# Patient Record
Sex: Female | Born: 2000 | Race: Black or African American | Hispanic: No | Marital: Single | Smoking: Never smoker
Health system: Southern US, Community
[De-identification: ages and names within clinical notes are randomized; demographics above are authoritative.]

## PROBLEM LIST (undated history)

## (undated) DIAGNOSIS — G43909 Migraine, unspecified, not intractable, without status migrainosus: Secondary | ICD-10-CM

## (undated) HISTORY — DX: Migraine, unspecified, not intractable, without status migrainosus: G43.909

## (undated) HISTORY — PX: NO PAST SURGERIES: SHX2092

---

## 2019-10-30 ENCOUNTER — Ambulatory Visit: Payer: Self-pay

## 2019-11-23 ENCOUNTER — Encounter: Payer: Self-pay | Admitting: Neurology

## 2019-11-23 ENCOUNTER — Other Ambulatory Visit: Payer: Self-pay

## 2019-11-23 ENCOUNTER — Ambulatory Visit: Payer: BC Managed Care – PPO | Admitting: Neurology

## 2019-11-23 VITALS — BP 122/84 | HR 88 | Temp 98.0°F | Ht 65.0 in | Wt 179.0 lb

## 2019-11-23 DIAGNOSIS — G43019 Migraine without aura, intractable, without status migrainosus: Secondary | ICD-10-CM

## 2019-11-23 MED ORDER — AIMOVIG 140 MG/ML ~~LOC~~ SOAJ
140.0000 mg | SUBCUTANEOUS | 5 refills | Status: AC
Start: 2019-11-23 — End: ?

## 2019-11-23 MED ORDER — UBRELVY 50 MG PO TABS: 50.0000 mg | ORAL_TABLET | ORAL | 3 refills | Status: AC | PRN

## 2019-11-23 MED ORDER — PROPRANOLOL HCL 10 MG PO TABS: 10.0000 mg | ORAL_TABLET | ORAL | 3 refills | Status: AC | PRN

## 2019-11-23 NOTE — Patient Instructions (Addendum)
Your neurological exam is normal thankfully.  Nevertheless, you have had an increase in your migraines.  For sexual activity related migraines you can try propranolol low-dose 10 mg strength half an hour to 1 hour before sexual activity.  For as needed use for acute migraines you can try Ubrelvy. Start Ubrelvy, 50 mg strength: Take 1 pill at onset of migraine headache, may repeat in 2 hours, no more than 2 pills in 24 hours. May cause sedation and nausea.  You should not combine Ubrelvy with Imitrex, only use 1 or the other. As discussed, we will start for migraine prevention: Aimovig 140 mg/ml, 1 inj subcutaneously every 30 days.  We can help you with your first injection and education.  Potential side effects include:  Gastrointestinal: Constipation (3%) Immunologic: Antibody development (3% to 6%) Local: Injection site reaction (5% to 6%) Neuromuscular & skeletal: Muscle cramps (?2%), muscle spasm (?2%) Dermatologic: Injection site reactions include itching, redness, pain at the injection site and very rarely: Anaphylaxis, angioedema (severe swelling including around mouth and tongue), hypersensitivity reaction (rare).    Please note that none of these above medications are considered safe in pregnancy.  As discussed, we will proceed with imaging testing including a brain MRI and MR angiogram to look at your blood vessels of the brain.  We will call you with the results.  We like to get insurance authorization first.  Please follow-up in 3 months to see one of our nurse practitioners.

## 2019-11-23 NOTE — Progress Notes (Signed)
Subjective:    Patient ID: Katherine Walton is a 19 y.o. female.  HPI     Star Age, MD, PhD [facility] [address], [address] [place], [place] [address]    Dear Dr. Ardeth Perfect,   I saw your patient, Katherine Walton, Upon your kind request in my neurologic clinic today for initial consultation of her migraine headaches.  The patient is unaccompanied today.  As you know, Katherine Walton is a 19 year old right-handed woman with an underlying medical history of borderline obesity, who reports a history of migraines for the past several years, essentially since she was about 34 or 19 years old.  She has been on ibuprofen as needed.  She has been on sumatriptan since she was in middle school as she recalls.  She has had infrequent migraines previously, 1 every 2 to 3 months typically.  In the recent month she has noticed an increase in her migraines to up to about 5/month this last month.  She has noted that her migraine has been connected to post sexual activity.  She typically tries to hydrate well.  She has taken ibuprofen and at times she has taken Imitrex.  She has never been on a preventative medication and would be interested in being able to take something to prevent migraines connected to sexual activity.  She is a non-smoker of cigarettes but does smoke marijuana socially.  She drinks alcohol infrequently, once or twice per month, she does not always hydrate well with water, averages about 2 bottles of water per day she estimates.  She has a family history of migraines affecting her father.  She has never had any one-sided weakness or numbness or tingling or droopy face or slurring of speech.  She typically does not have a visual aura.  She had an eye examination within a year.  She has not tried any other medications, has been on as needed nausea medication.  She has associated nausea, light sensitivity, and a throbbing headache, which can start in the back of her head.   She has noticed that it does help to sleep.   Her Past Medical History Is Significant For: Past Medical History:  Diagnosis Date  . Migraine     Her Past Surgical History Is Significant For: Past Surgical History:  Procedure Laterality Date  . NO PAST SURGERIES      Her Family History Is Significant For: Family History  Problem Relation Age of Onset  . Hypertension Mother   . Thyroid disease Mother   . Thyroid disease Father   . Diabetes Maternal Grandmother   . Diabetes Paternal Grandfather     Her Social History Is Significant For: Social History   Socioeconomic History  . Marital status: Single    Spouse name: Not on file  . Number of children: Not on file  . Years of education: Not on file  . Highest education level: Not on file  Occupational History  . Not on file  Tobacco Use  . Smoking status: Never Smoker  . Smokeless tobacco: Never Used  Substance and Sexual Activity  . Alcohol use: Yes    Comment: social  . Drug use: Yes    Types: Marijuana    Comment: Socially  . Sexual activity: Not on file  Other Topics Concern  . Not on file  Social History Narrative  . Not on file   Social Determinants of Health   Financial Resource Strain:   . Difficulty of Paying  Living Expenses:   Food Insecurity:   . Worried About Programme researcher, broadcasting/film/video in the Last Year:   . Barista in the Last Year:   Transportation Needs:   . Freight forwarder (Medical):   Marland Kitchen Lack of Transportation (Non-Medical):   Physical Activity:   . Days of Exercise per Week:   . Minutes of Exercise per Session:   Stress:   . Feeling of Stress :   Social Connections:   . Frequency of Communication with Friends and Family:   . Frequency of Social Gatherings with Friends and Family:   . Attends Religious Services:   . Active Member of Clubs or Organizations:   . Attends Banker Meetings:   Marland Kitchen Marital Status:     Her Allergies Are:  No Known Allergies:   Her  Current Medications Are:  Outpatient Encounter Medications as of 11/23/2019  Medication Sig  . Etonogestrel (NEXPLANON [place]) Inject into the skin.  Marland Kitchen ibuprofen (ADVIL) 800 MG tablet Take 800 mg by mouth 3 (three) times daily as needed.  . norethindrone (AYGESTIN) 5 MG tablet Take 10 mg by mouth daily.  . SUMAtriptan (IMITREX) 25 MG tablet TAKE 1 PILL AT THE ONSET OF A MIGRAINE CAN REPEAT DOSE IN 2 HR IF NEEDED   No facility-administered encounter medications on file as of 11/23/2019.  : Review of Systems:  Out of a complete 14 point review of systems, all are reviewed and negative with the exception of these symptoms as listed below:  Review of Systems  Neurological:       Here for consult on migraines. She reports 5-6 migraines this month. She reports she is taking ibuprofen, sumatriptan to help but has not had much relief. Pt notices when she is intimate this is a trigger for her migraines.     Objective:  Neurological Exam  Physical Exam Physical Examination:   Vitals:   11/23/19 1448  BP: 122/84  Pulse: 88  Temp: 98 F (36.7 C)  SpO2: 98%    General Examination: The patient is a very pleasant 19 y.o. female in no acute distress. She appears well-developed and well-nourished and well groomed.   HEENT: Normocephalic, atraumatic, pupils are equal, round and reactive to light and accommodation. Funduscopic exam is normal with sharp disc margins noted.  She has corrective eyeglasses.  Extraocular tracking is good without limitation to gaze excursion or nystagmus noted. Normal smooth pursuit is noted. Hearing is grossly intact. Face is symmetric with normal facial animation and normal facial sensation. Speech is clear with no dysarthria noted. There is no hypophonia. There is no lip, neck/head, jaw or voice tremor. Neck is supple with full range of passive and active motion. There are no carotid bruits on auscultation. Oropharynx exam reveals: mild mouth dryness, adequate dental hygiene.  Tongue protrudes centrally and palate elevates symmetrically.   Chest: Clear to auscultation without wheezing, rhonchi or crackles noted.  Heart: S1+S2+0, regular and normal without murmurs, rubs or gallops noted.   Abdomen: Soft, non-tender and non-distended with normal bowel sounds appreciated on auscultation.  Extremities: There is no pitting edema in the distal lower extremities bilaterally. Pedal pulses are intact.  Skin: Warm and dry without trophic changes noted.  Musculoskeletal: exam reveals no obvious joint deformities, tenderness or joint swelling or erythema.   Neurologically:  Mental status: The patient is awake, alert and oriented in all 4 spheres. Her immediate and remote memory, attention, language skills and fund of knowledge are  appropriate. There is no evidence of aphasia, agnosia, apraxia or anomia. Speech is clear with normal prosody and enunciation. Thought process is linear. Mood is normal and affect is normal.  Cranial nerves II - XII are as described above under HEENT exam. In addition: shoulder shrug is normal with equal shoulder height noted. Motor exam: Normal bulk, strength and tone is noted. There is no drift, tremor or rebound. Romberg is negative. Reflexes are 2+ throughout. Babinski: Toes are flexor bilaterally. Fine motor skills and coordination: intact with normal finger taps, normal hand movements, normal rapid alternating patting, normal foot taps and normal foot agility.  Cerebellar testing: No dysmetria or intention tremor on finger to nose testing. Heel to shin is unremarkable bilaterally. There is no truncal or gait ataxia.  Sensory exam: intact to light touch, vibration, temperature sense in the upper and lower extremities.  Gait, station and balance: She stands easily. No veering to one side is noted. No leaning to one side is noted. Posture is age-appropriate and stance is narrow based. Gait shows normal stride length and normal pace. No problems  turning are noted. Tandem walk is unremarkable.   Assessment and Plan:   In summary, Katherine Walton is a very pleasant 19 y.o.-year old female with an underlying medical history of borderline obesity, who presents for evaluation of her migraine headaches.  In the past month she has noticed an increase in her migraine frequency and intensity, typically connected to having had sexual activity.  She has a longstanding history of migraines without aura and typically would have 1 migraine every 2 to 3 months and therefore was only on as needed medication.  We talked about her headache condition quite a bit today.  We talked about typical migraine triggers as well.  She has a nonfocal neurologic exam which is reassuring.  Nevertheless, given the change in her migraine pattern I would like to proceed with a brain MRI and MRA head.  We talked about management of migraines including abortive treatment and preventative treatment.  We talked about trying to prevent migraines and connection with sexual activity and I suggested a trial of low-dose propranolol about 30 to 60 minutes before planned sexual activity.  She can continue to use ibuprofen as needed and Imitrex as needed.  We also talked about the newer medication options.  I suggested a trial of as needed Ubrelvy 50 mg strength.  I talked about expectations and potential side effects.  She is advised not to combine this medication with Imitrex and only use one or the other.  We also talked about the newer injectable preventative medications.  She is advised to start Aimovig monthly injection.  We will call her with her brain scan results and plan to follow her routinely in the office in about 3 months to see one of our nurse practitioners.  She was provided with 3 prescriptions today and written instructions.  I answered all her questions today and she was in agreement with the plan.   Thank you very much for allowing me to participate in the care of this nice  patient. If I can be of any further assistance to you please do not hesitate to call me at [phone].  Sincerely,   Huston Foley, MD, PhD

## 2019-11-24 LAB — COMPREHENSIVE METABOLIC PANEL
ALT: 12 IU/L (ref 0–32)
AST: 14 IU/L (ref 0–40)
Albumin/Globulin Ratio: 2 (ref 1.2–2.2)
Albumin: 4.9 g/dL (ref 3.9–5.0)
Alkaline Phosphatase: 59 IU/L (ref 39–117)
BUN/Creatinine Ratio: 8 — ABNORMAL LOW (ref 9–23)
BUN: 6 mg/dL (ref 6–20)
Bilirubin Total: 0.3 mg/dL (ref 0.0–1.2)
CO2: 25 mmol/L (ref 20–29)
Calcium: 10 mg/dL (ref 8.7–10.2)
Chloride: 103 mmol/L (ref 96–106)
Creatinine, Ser: 0.73 mg/dL (ref 0.57–1.00)
GFR calc Af Amer: 136 mL/min/{1.73_m2} (ref >59)
GFR calc non Af Amer: 118 mL/min/{1.73_m2} (ref >59)
Globulin, Total: 2.4 g/dL (ref 1.5–4.5)
Glucose: 83 mg/dL (ref 65–99)
Potassium: 4 mmol/L (ref 3.5–5.2)
Sodium: 139 mmol/L (ref 134–144)
Total Protein: 7.3 g/dL (ref 6.0–8.5)

## 2019-11-25 ENCOUNTER — Telehealth: Payer: Self-pay

## 2019-11-25 NOTE — Telephone Encounter (Signed)
PA for Bernita Raisin has been submitted and received instant approval. (Key: [id number])  This request has been approved.  Please note any additional information provided by [employer] at the bottom of your screen. Pa is effective until 11/25/2019.

## 2019-12-01 ENCOUNTER — Telehealth: Payer: Self-pay | Admitting: Neurology

## 2019-12-01 NOTE — Telephone Encounter (Signed)
Patient is scheduled at [facility] for 01/12/20.

## 2019-12-07 ENCOUNTER — Telehealth: Payer: Self-pay

## 2019-12-07 NOTE — Telephone Encounter (Signed)
PA for Aimovig has been sent via cover my meds.   (Key: [id number])  [employer] is reviewing your PA request and will respond within 24 hours for Medicaid or up to 72 hours for non-Medicaid plans, based on the required timeframe determined by state or federal regulations. To check for an update later, open this request from your dashboard.

## 2019-12-08 NOTE — Telephone Encounter (Signed)
PA for aimovig has been denied due to the pt not trying and falling to 2 prophylactic therapies. Ex. Beta blockers, tricyclic antidepressant, calcium channel blocker ect. Pt has a discount card for aimvoig and should be able to use since she has Nurse, learning disability.

## 2020-01-03 NOTE — Telephone Encounter (Signed)
[employer] Auth: [id number] (exp. 01/03/20 to 02/01/20)

## 2020-01-12 ENCOUNTER — Other Ambulatory Visit: Payer: BC Managed Care – PPO

## 2020-02-28 ENCOUNTER — Ambulatory Visit: Payer: BC Managed Care – PPO | Admitting: Family Medicine

## 2020-02-28 ENCOUNTER — Encounter: Payer: Self-pay | Admitting: Family Medicine

## 2020-02-28 VITALS — BP 115/74 | HR 94 | Ht 65.0 in | Wt 179.0 lb

## 2020-02-28 DIAGNOSIS — G43019 Migraine without aura, intractable, without status migrainosus: Secondary | ICD-10-CM | POA: Diagnosis not present

## 2020-02-28 NOTE — Progress Notes (Addendum)
PATIENT: Katherine Walton DOB: [birth year]  REASON FOR VISIT: follow up HISTORY FROM: patient  Chief Complaint  Patient presents with  . Follow-up    rm 1, alone, pt states migraines are worse, has not started aimovig      HISTORY OF PRESENT ILLNESS: Today 02/28/20 Katherine Walton is a 19 y.o. female here today for follow up for migraines. MRI was ordered but not performed. She was prescribed Amovig monthly and Ubrelvy for abortive therapy. She has not started either of these medications. She reports that headaches have not changed. She takes ibuprofen multiple times a week due to persistent headaches. She has not had a "really bad" headache in quite sometime. She usually takes Imitrex for migraines but has not needed this recently. She was advised to use propranolol 10mg  30-60 minutes prior to sexual activity. She reports taking this once and did not suffer from a migraine with that encounter.   HISTORY: (copied from Dr note on 11/23/2019)  Dear Dr. 11/25/2019,   I saw your patient, Katherine Walton, Upon your kind request in my neurologic clinic today for initial consultation of her migraine headaches.  The patient is unaccompanied today.  As you know, Katherine Walton is a 19 year old right-handed woman with an underlying medical history of borderline obesity, who reports a history of migraines for the past several years, essentially since she was about 54 or 19 years old.  She has been on ibuprofen as needed.  She has been on sumatriptan since she was in middle school as she recalls.  She has had infrequent migraines previously, 1 every 2 to 3 months typically.  In the recent month she has noticed an increase in her migraines to up to about 5/month this last month.  She has noted that her migraine has been connected to post sexual activity.  She typically tries to hydrate well.  She has taken ibuprofen and at times she has taken Imitrex.  She has never been on a preventative medication and would be  interested in being able to take something to prevent migraines connected to sexual activity.  She is a non-smoker of cigarettes but does smoke marijuana socially.  She drinks alcohol infrequently, once or twice per month, she does not always hydrate well with water, averages about 2 bottles of water per day she estimates.  She has a family history of migraines affecting her father.  She has never had any one-sided weakness or numbness or tingling or droopy face or slurring of speech.  She typically does not have a visual aura.  She had an eye examination within a year.  She has not tried any other medications, has been on as needed nausea medication.  She has associated nausea, light sensitivity, and a throbbing headache, which can start in the back of her head.  She has noticed that it does help to sleep.    REVIEW OF SYSTEMS: Out of a complete 14 system review of symptoms, the patient complains only of the following symptoms, headaches and all other reviewed systems are negative.  ALLERGIES: No Known Allergies  HOME MEDICATIONS: Outpatient Medications Prior to Visit  Medication Sig Dispense Refill  . Etonogestrel (NEXPLANON [place]) Inject into the skin.    8 ibuprofen (ADVIL) 800 MG tablet Take 800 mg by mouth 3 (three) times daily as needed.    . norethindrone (AYGESTIN) 5 MG tablet Take 10 mg by mouth daily.    . propranolol (INDERAL) 10 MG tablet Take 1 tablet (10  mg total) by mouth as needed. 30 tablet 3  . SUMAtriptan (IMITREX) 25 MG tablet TAKE 1 PILL AT THE ONSET OF A MIGRAINE CAN REPEAT DOSE IN 2 HR IF NEEDED    . Ubrogepant (UBRELVY) 50 MG TABS Take 50 mg by mouth as needed (may repeat once in 2 hours. no more than 2 pills in 24 h.). 10 tablet 3  . Erenumab-aooe (AIMOVIG) 140 MG/ML SOAJ Inject 140 mg into the skin every 30 (thirty) days. (Patient not taking: Reported on 02/28/2020) 1 pen 5   No facility-administered medications prior to visit.    PAST MEDICAL HISTORY: Past Medical  History:  Diagnosis Date  . Migraine     PAST SURGICAL HISTORY: Past Surgical History:  Procedure Laterality Date  . NO PAST SURGERIES      FAMILY HISTORY: Family History  Problem Relation Age of Onset  . Hypertension Mother   . Thyroid disease Mother   . Thyroid disease Father   . Diabetes Maternal Grandmother   . Diabetes Paternal Grandfather     SOCIAL HISTORY: Social History   Socioeconomic History  . Marital status: Single    Spouse name: Not on file  . Number of children: Not on file  . Years of education: Not on file  . Highest education level: Not on file  Occupational History  . Not on file  Tobacco Use  . Smoking status: Never Smoker  . Smokeless tobacco: Never Used  Substance and Sexual Activity  . Alcohol use: Yes    Comment: social  . Drug use: Yes    Types: Marijuana    Comment: Socially  . Sexual activity: Not on file  Other Topics Concern  . Not on file  Social History Narrative  . Not on file   Social Determinants of Health   Financial Resource Strain:   . Difficulty of Paying Living Expenses:   Food Insecurity:   . Worried About Programme researcher, broadcasting/film/video in the Last Year:   . Barista in the Last Year:   Transportation Needs:   . Freight forwarder (Medical):   Marland Kitchen Lack of Transportation (Non-Medical):   Physical Activity:   . Days of Exercise per Week:   . Minutes of Exercise per Session:   Stress:   . Feeling of Stress :   Social Connections:   . Frequency of Communication with Friends and Family:   . Frequency of Social Gatherings with Friends and Family:   . Attends Religious Services:   . Active Member of Clubs or Organizations:   . Attends Banker Meetings:   Marland Kitchen Marital Status:   Intimate Partner Violence:   . Fear of Current or Ex-Partner:   . Emotionally Abused:   Marland Kitchen Physically Abused:   . Sexually Abused:       PHYSICAL EXAM  Vitals:   02/28/20 1533  BP: 115/74  Pulse: 94  Weight: 179 lb (81.2  kg)  Height: 5\' 5"  (1.651 m)   Body mass index is 29.79 kg/m.  Generalized: Well developed, in no acute distress  Cardiology: normal rate and rhythm, no murmur noted Respiratory: clear to auscultation bilaterally  Neurological examination  Mentation: Alert oriented to time, place, history taking. Follows all commands speech and language fluent Cranial nerve II-XII: Pupils were equal round reactive to light. Extraocular movements were full, visual field were full  Motor: The motor testing reveals 5 over 5 strength of all 4 extremities. Good symmetric motor tone is  noted throughout.  Gait and station: Gait is normal.   DIAGNOSTIC DATA (LABS, IMAGING, TESTING) - I reviewed patient records, labs, notes, testing and imaging myself where available.  No flowsheet data found.   No results found for: WBC, HGB, HCT, MCV, PLT    Component Value Date/Time   NA 139 11/23/2019 1613   K 4.0 11/23/2019 1613   CL 103 11/23/2019 1613   CO2 25 11/23/2019 1613   GLUCOSE 83 11/23/2019 1613   BUN 6 11/23/2019 1613   CREATININE 0.73 11/23/2019 1613   CALCIUM 10.0 11/23/2019 1613   PROT 7.3 11/23/2019 1613   ALBUMIN 4.9 11/23/2019 1613   AST 14 11/23/2019 1613   ALT 12 11/23/2019 1613   ALKPHOS 59 11/23/2019 1613   BILITOT 0.3 11/23/2019 1613   GFRNONAA 118 11/23/2019 1613   GFRAA 136 11/23/2019 1613   No results found for: CHOL, HDL, LDLCALC, LDLDIRECT, TRIG, CHOLHDL No results found for: JIRC7E No results found for: VITAMINB12 No results found for: TSH     ASSESSMENT AND PLAN 19 y.o. year old female  has a past medical history of Migraine. here with     ICD-10-CM   1. Intractable migraine without aura and without status migrainosus  G43.019     Katherine Walton has not started [place] or [place]. She continues to have near daily headaches but, fortunately, has not had significant concerns of migraines since last being seen. I have reeducated her on the different medications prescribed. She was  advised to start Amovig injections every 30 days for headache prevention. Copay card given in the office. She was advised to use Ubrelvy as needed for abortive therapy. She was advised against regular use of ibuprofen. Adequate hydration, well balanced diet and regular exercise encouraged. She will follow up in 3 months, sooner if needed. She verbalizes understanding and agreement with this plan.    No orders of the defined types were placed in this encounter.    No orders of the defined types were placed in this encounter.     I spent 15 minutes with the patient. 50% of this time was spent counseling and educating patient on plan of care and medications.    Shawnie Dapper, FNP-C 02/28/2020, 3:47 PM [facility] [address], [address] [place], [place] [address] [phone]  I reviewed the above note and documentation by the Nurse Practitioner and agree with the history, exam, assessment and plan as outlined above. I was available for consultation. Huston Foley, MD, PhD [facility])

## 2020-02-28 NOTE — Patient Instructions (Addendum)
We will start Amovig injections every month for migraine/headache prevention. Use propranolol 10mg  30-60 minutes prior to sexual activity to prevent migraine.   Use Ubrelvy for abortive therapy (to get rid of headache). Avoid regular use of Ubrelvy or ibuprofen.   Stay well hydrated. Eat a well balanced diet and exercise regularly.   Follow up in 3 months   Ubrogepant tablets What is this medicine? UBROGEPANT (ue BROE je pant) is used to treat migraine headaches with or without aura. An aura is a strange feeling or visual disturbance that warns you of an attack. It is not used to prevent migraines. This medicine may be used for other purposes; ask your health care provider or pharmacist if you have questions. COMMON BRAND NAME(S): What should I tell my health care provider before I take this medicine? They need to know if you have any of these conditions:  kidney disease  liver disease  an unusual or allergic reaction to ubrogepant, other medicines, foods, dyes, or preservatives  pregnant or trying to get pregnant  breast-feeding How should I use this medicine? Take this medicine by mouth with a glass of water. Follow the directions on the prescription label. You can take it with or without food. If it upsets your stomach, take it with food. Take your medicine at regular intervals. Do not take it more often than directed. Do not stop taking except on your doctor's advice. Talk to your pediatrician about the use of this medicine in children. Special care may be needed. Overdosage: If you think you have taken too much of this medicine contact a poison control center or emergency room at once. NOTE: This medicine is only for you. Do not share this medicine with others. What if I miss a dose? This does not apply. This medicine is not for regular use. What may interact with this medicine? Do not take this medicine with any of the following medicines:  ceritinib  certain  antibiotics like chloramphenicol, clarithromycin, telithromycin  certain antivirals for HIV like atazanavir, cobicistat, darunavir, delavirdine, fosamprenavir, indinavir, ritonavir  certain medicines for fungal infections like itraconazole, ketoconazole, posaconazole, voriconazole  conivaptan  grapefruit  idelalisib  mifepristone  nefazodone  ribociclib This medicine may also interact with the following medications:  carvedilol  certain medicines for seizures like phenobarbital, phenytoin  ciprofloxacin  cyclosporine  eltrombopag  fluconazole  fluvoxamine  quinidine  rifampin  St. John's wort  verapamil This list may not describe all possible interactions. Give your health care provider a list of all the medicines, herbs, non-prescription drugs, or dietary supplements you use. Also tell them if you smoke, drink alcohol, or use illegal drugs. Some items may interact with your medicine. What should I watch for while using this medicine? Visit your health care professional for regular checks on your progress. Tell your health care professional if your symptoms do not start to get better or if they get worse. Your mouth may get dry. Chewing sugarless gum or sucking hard candy and drinking plenty of water may help. Contact your health care professional if the problem does not go away or is severe. What side effects may I notice from receiving this medicine? Side effects that you should report to your doctor or health care professional as soon as possible:  allergic reactions like skin rash, itching or hives; swelling of the face, lips, or tongue Side effects that usually do not require medical attention (report these to your doctor or health care professional if they continue  or are bothersome):  drowsiness  dry mouth  nausea  tiredness This list may not describe all possible side effects. Call your doctor for medical advice about side effects. You may report side  effects to FDA at 1-800-FDA-1088. Where should I keep my medicine? Keep out of the reach of children. Store at room temperature between 15 and 30 degrees C (59 and 86 degrees F). Throw away any unused medicine after the expiration date. NOTE: This sheet is a summary. It may not cover all possible information. If you have questions about this medicine, talk to your doctor, pharmacist, or health care provider.  2020 Elsevier/Gold Standard (2018-10-01 08:50:55)   Katherine Walton injection What is this medicine? ERENUMAB (e REN ue mab) is used to prevent migraine headaches. This medicine may be used for other purposes; ask your health care provider or pharmacist if you have questions. COMMON BRAND NAME(S): Aimovig What should I tell my health care provider before I take this medicine? They need to know if you have any of these conditions:  an unusual or allergic reaction to erenumab, latex, other medicines, foods, dyes, or preservatives  high blood pressure  pregnant or trying to get pregnant  breast-feeding How should I use this medicine? This medicine is for injection under the skin. You will be taught how to prepare and give this medicine. Use exactly as directed. Take your medicine at regular intervals. Do not take your medicine more often than directed. It is important that you put your used needles and syringes in a special sharps container. Do not put them in a trash can. If you do not have a sharps container, call your pharmacist or healthcare provider to get one. Talk to your pediatrician regarding the use of this medicine in children. Special care may be needed. Overdosage: If you think you have taken too much of this medicine contact a poison control center or emergency room at once. NOTE: This medicine is only for you. Do not share this medicine with others. What if I miss a dose? If you miss a dose, take it as soon as you can. If it is almost time for your next dose, take only that  dose. Do not take double or extra doses. What may interact with this medicine? Interactions are not expected. This list may not describe all possible interactions. Give your health care provider a list of all the medicines, herbs, non-prescription drugs, or dietary supplements you use. Also tell them if you smoke, drink alcohol, or use illegal drugs. Some items may interact with your medicine. What should I watch for while using this medicine? Tell your doctor or healthcare professional if your symptoms do not start to get better or if they get worse. What side effects may I notice from receiving this medicine? Side effects that you should report to your doctor or health care professional as soon as possible:  allergic reactions like skin rash, itching or hives, swelling of the face, lips, or tongue  chest pain  fast, irregular heartbeat  feeling faint or lightheaded  palpitations Side effects that usually do not require medical attention (report these to your doctor or health care professional if they continue or are bothersome):  constipation  muscle cramps  pain, redness, or irritation at site where injected This list may not describe all possible side effects. Call your doctor for medical advice about side effects. You may report side effects to FDA at 1-800-FDA-1088. Where should I keep my medicine? Keep out of the  reach of children. You will be instructed on how to store this medicine. Throw away any unused medicine after the expiration date on the label. NOTE: This sheet is a summary. It may not cover all possible information. If you have questions about this medicine, talk to your doctor, pharmacist, or health care provider.  2020 Elsevier/Gold Standard (2018-11-30 15:43:58)   Migraine Headache A migraine headache is a very strong throbbing pain on one side or both sides of your head. This type of headache can also cause other symptoms. It can last from 4 hours to 3 days.  Talk with your doctor about what things may bring on (trigger) this condition. What are the causes? The exact cause of this condition is not known. This condition may be triggered or caused by:  Drinking alcohol.  Smoking.  Taking medicines, such as: ? Medicine used to treat chest pain (nitroglycerin). ? Birth control pills. ? Estrogen. ? Some blood pressure medicines.  Eating or drinking certain products.  Doing physical activity. Other things that may trigger a migraine headache include:  Having a menstrual period.  Pregnancy.  Hunger.  Stress.  Not getting enough sleep or getting too much sleep.  Weather changes.  Tiredness (fatigue). What increases the risk?  Being 2025-524 years old.  Being female.  Having a family history of migraine headaches.  Being Caucasian.  Having depression or anxiety.  Being very overweight. What are the signs or symptoms?  A throbbing pain. This pain may: ? Happen in any area of the head, such as on one side or both sides. ? Make it hard to do daily activities. ? Get worse with physical activity. ? Get worse around bright lights or loud noises.  Other symptoms may include: ? Feeling sick to your stomach (nauseous). ? Vomiting. ? Dizziness. ? Being sensitive to bright lights, loud noises, or smells.  Before you get a migraine headache, you may get warning signs (an aura). An aura may include: ? Seeing flashing lights or having blind spots. ? Seeing bright spots, halos, or zigzag lines. ? Having tunnel vision or blurred vision. ? Having numbness or a tingling feeling. ? Having trouble talking. ? Having weak muscles.  Some people have symptoms after a migraine headache (postdromal phase), such as: ? Tiredness. ? Trouble thinking (concentrating). How is this treated?  Taking medicines that: ? Relieve pain. ? Relieve the feeling of being sick to your stomach. ? Prevent migraine headaches.  Treatment may also  include: ? Having acupuncture. ? Avoiding foods that bring on migraine headaches. ? Learning ways to control your body functions (biofeedback). ? Therapy to help you know and deal with negative thoughts (cognitive behavioral therapy). Follow these instructions at home: Medicines  Take over-the-counter and prescription medicines only as told by your doctor.  Ask your doctor if the medicine prescribed to you: ? Requires you to avoid driving or using heavy machinery. ? Can cause trouble pooping (constipation). You may need to take these steps to prevent or treat trouble pooping:  Drink enough fluid to keep your pee (urine) pale yellow.  Take over-the-counter or prescription medicines.  Eat foods that are high in fiber. These include beans, whole grains, and fresh fruits and vegetables.  Limit foods that are high in fat and sugar. These include fried or sweet foods. Lifestyle  Do not drink alcohol.  Do not use any products that contain nicotine or tobacco, such as cigarettes, e-cigarettes, and chewing tobacco. If you need help quitting, ask your doctor.  Get at least 8 hours of sleep every night.  Limit and deal with stress. General instructions      Keep a journal to find out what may bring on your migraine headaches. For example, write down: ? What you eat and drink. ? How much sleep you get. ? Any change in what you eat or drink. ? Any change in your medicines.  If you have a migraine headache: ? Avoid things that make your symptoms worse, such as bright lights. ? It may help to lie down in a dark, quiet room. ? Do not drive or use heavy machinery. ? Ask your doctor what activities are safe for you.  Keep all follow-up visits as told by your doctor. This is important. Contact a doctor if:  You get a migraine headache that is different or worse than others you have had.  You have more than 15 headache days in one month. Get help right away if:  Your migraine  headache gets very bad.  Your migraine headache lasts longer than 72 hours.  You have a fever.  You have a stiff neck.  You have trouble seeing.  Your muscles feel weak or like you cannot control them.  You start to lose your balance a lot.  You start to have trouble walking.  You pass out (faint).  You have a seizure. Summary  A migraine headache is a very strong throbbing pain on one side or both sides of your head. These headaches can also cause other symptoms.  This condition may be treated with medicines and changes to your lifestyle.  Keep a journal to find out what may bring on your migraine headaches.  Contact a doctor if you get a migraine headache that is different or worse than others you have had.  Contact your doctor if you have more than 15 headache days in a month. This information is not intended to replace advice given to you by your health care provider. Make sure you discuss any questions you have with your health care provider. Document Revised: 11/06/2018 Document Reviewed: 08/27/2018 Elsevier Patient Education  2020 [employer].

## 2020-02-29 ENCOUNTER — Telehealth: Payer: Self-pay | Admitting: Neurology

## 2020-02-29 NOTE — Telephone Encounter (Signed)
Patient returned my call she is scheduled at [facility] for 03/08/20. 

## 2020-02-29 NOTE — Telephone Encounter (Signed)
LVM for pt to call back about scheduling mri  [employer] updated auth: [id number] (exp. 02/29/20 to 03/29/20)

## 2020-03-08 ENCOUNTER — Ambulatory Visit: Payer: BC Managed Care – PPO

## 2020-03-08 DIAGNOSIS — G43019 Migraine without aura, intractable, without status migrainosus: Secondary | ICD-10-CM | POA: Diagnosis not present

## 2020-03-08 MED ORDER — GADOBENATE DIMEGLUMINE 529 MG/ML IV SOLN
15.0000 mL | Freq: Once | INTRAVENOUS | Status: AC | PRN
  Administered 2020-03-08: 15 mL via INTRAVENOUS

## 2020-03-09 ENCOUNTER — Telehealth: Payer: Self-pay | Admitting: Family Medicine

## 2020-03-09 NOTE — Telephone Encounter (Signed)
Pt states she was told to call if on today her arm is still hurting from the MRI IV on yesterday.  Please call with suggestions.

## 2020-03-09 NOTE — Telephone Encounter (Signed)
I called pt and relayed that we are glad to glance at it if that would make her feel better. Using ibuprofen and heat compress may make feel better, since improving that is good. I LVM for her re: this after speaking to AL/NP.  Health center at [facility] is other option over weekend if needed.  She is to call back.

## 2020-03-13 ENCOUNTER — Telehealth: Payer: Self-pay

## 2020-03-13 DIAGNOSIS — I6521 Occlusion and stenosis of right carotid artery: Secondary | ICD-10-CM

## 2020-03-13 NOTE — Telephone Encounter (Signed)
-----   Message from Asa Lente, MD sent at 03/09/2020  5:17 PM EDT ----- The MRI of the brain looks normal.  The MR angiogram showed some narrowing of a couple of the arteries in the brain.  There were no aneurysms.  We need to order a CT angiogram of the head that might give a better picture of this area than the MR angiogram

## 2020-03-13 NOTE — Telephone Encounter (Signed)
I called the pt and advised of results. Pt is agreeable to CT angio. Order sent to MD to sign off

## 2020-03-13 NOTE — Telephone Encounter (Signed)
As recommended, based on the recent brain MRA report, we will proceed with CT angiogram of the head with and without contrast.

## 2020-03-14 ENCOUNTER — Telehealth: Payer: Self-pay | Admitting: Neurology

## 2020-03-14 NOTE — Telephone Encounter (Signed)
[employer] Auth: [id number] (exp. 03/14/20 to 04/12/20) order sent to GI. They will reach out to the patient to schedule.

## 2020-03-24 ENCOUNTER — Ambulatory Visit
Admission: RE | Admit: 2020-03-24 | Discharge: 2020-03-24 | Disposition: A | Discharge status: Home / Self Care | Payer: BC Managed Care – PPO | Source: Ambulatory Visit | Attending: Neurology | Admitting: Neurology

## 2020-03-24 ENCOUNTER — Other Ambulatory Visit: Payer: Self-pay

## 2020-03-24 DIAGNOSIS — I6521 Occlusion and stenosis of right carotid artery: Secondary | ICD-10-CM

## 2020-03-24 IMAGING — CT CT ANGIO HEAD
2 of 4 series · 6 of 30 positions shown · IV contrast (iopamidol)
Comparison: MRI head and MRA head [DATE]

CLINICAL DATA: Headache.  Abnormal MRA.  Rule out MCA stenosis.

EXAM:
CT ANGIOGRAPHY HEAD
TECHNIQUE: Multidetector CT imaging of the head was performed using the
standard protocol during bolus administration of intravenous
contrast. Multiplanar CT image reconstructions and MIPs were
obtained to evaluate the vascular anatomy.
CONTRAST:  75mL [M5] IOPAMIDOL ([M5]) INJECTION 76%

[Series 3: head w/(date) · axial · 0.44mm/px · z∈[+909,+969]mm · 2 of 36 slices shown]
[im 12/36  brain]
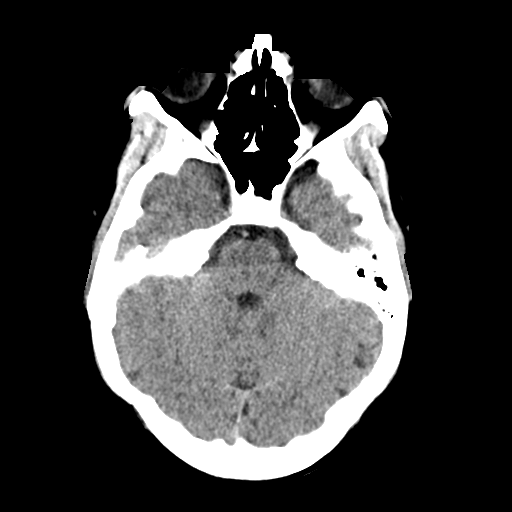
[im 24/36  brain]
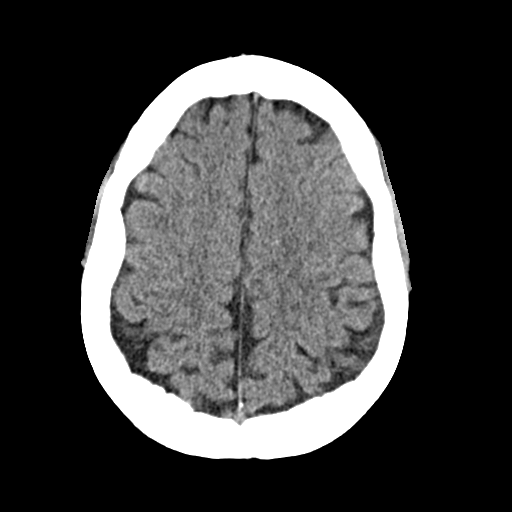

[Series 8: head angio · axial · 0.47mm/px · z∈[+887,+992]mm · 4 of 59 slices shown]
[im 12/59  brain]
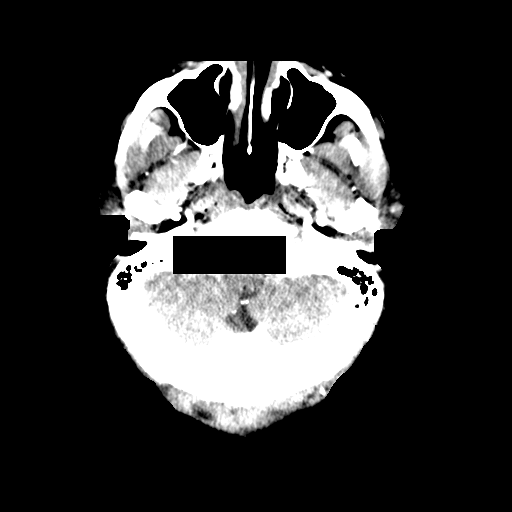
[im 24/59  bone]
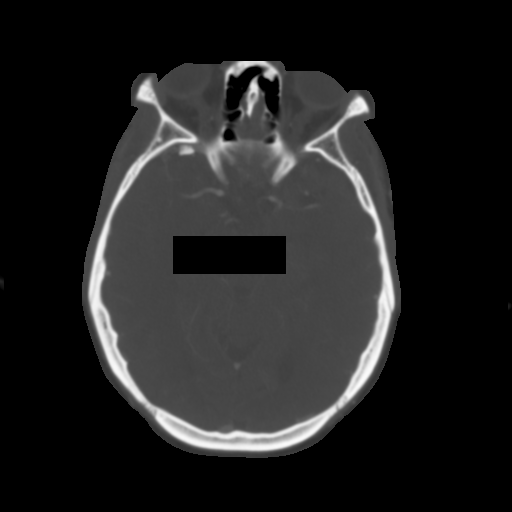
[im 35/59  brain]
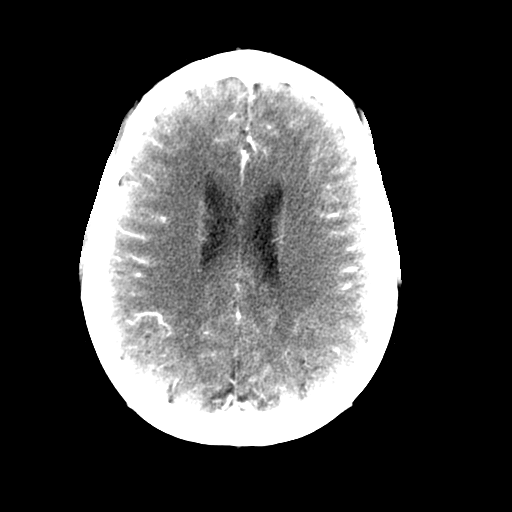
[im 47/59  bone]
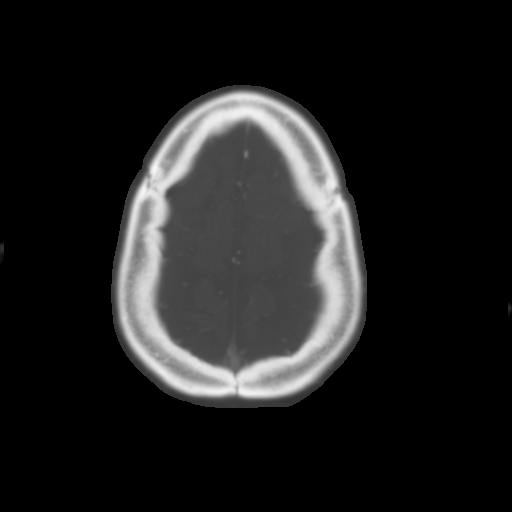

[6 of 30 positions shown; findings below may reference images not displayed]

FINDINGS: CT HEAD

Brain: Ventricle size normal. Negative for acute infarct,
hemorrhage, mass

Vascular: Negative for hyperdense vessel

Skull: Negative

Sinuses: Paranasal sinuses clear.

Orbits: Negative orbit.

CTA HEAD

Anterior circulation: Cavernous carotid normal bilaterally without
stenosis or calcification. Negative for aneurysm.

Anterior and middle cerebral arteries patent bilaterally without
stenosis. Right M1 signal loss on MRA appears to be artifact.

Posterior circulation: Both vertebral arteries patent to the basilar
without stenosis. PICA patent bilaterally. Basilar widely patent.
Superior cerebellar and posterior cerebral arteries patent
bilaterally without stenosis. Posterior communicating artery is
patent bilaterally. Negative for stenosis.

Venous sinuses: Normal venous enhancement.

Anatomic variants: None
IMPRESSION: Normal CT angio head. No significant intracranial stenosis or
aneurysm

CT head negative

## 2020-03-24 MED ORDER — IOPAMIDOL (ISOVUE-370) INJECTION 76%
75.0000 mL | Freq: Once | INTRAVENOUS | Status: AC | PRN
  Administered 2020-03-24: 75 mL via INTRAVENOUS

## 2020-03-27 NOTE — Progress Notes (Signed)
Please call patient and advise her that her most recent imaging test, CT angiogram of the head was reported as normal.  This is reassuring.  She can keep her appointment as scheduled with Amy.

## 2020-03-28 ENCOUNTER — Telehealth: Payer: Self-pay

## 2020-03-28 NOTE — Telephone Encounter (Signed)
Pt notified of results and verbalized understanding. Will keep appt as scheduled for November with AL, NP.

## 2020-03-28 NOTE — Telephone Encounter (Signed)
-----   Message from Huston Foley, MD sent at 03/27/2020  6:28 PM EDT ----- Please call patient and advise her that her most recent imaging test, CT angiogram of the head was reported as normal.  This is reassuring.  She can keep her appointment as scheduled with Amy.

## 2020-05-30 ENCOUNTER — Encounter: Payer: Self-pay | Admitting: Family Medicine

## 2020-05-30 ENCOUNTER — Ambulatory Visit: Payer: BC Managed Care – PPO | Admitting: Family Medicine
